# Patient Record
Sex: Male | Born: 1995 | Race: White | Hispanic: No | Marital: Single | Smoking: Never smoker
Health system: Southern US, Community
[De-identification: ages and names within clinical notes are randomized; demographics above are authoritative.]

## PROBLEM LIST (undated history)

## (undated) DIAGNOSIS — F329 Major depressive disorder, single episode, unspecified: Secondary | ICD-10-CM

## (undated) DIAGNOSIS — F32A Depression, unspecified: Secondary | ICD-10-CM

## (undated) DIAGNOSIS — F909 Attention-deficit hyperactivity disorder, unspecified type: Secondary | ICD-10-CM

## (undated) DIAGNOSIS — G4730 Sleep apnea, unspecified: Secondary | ICD-10-CM

## (undated) HISTORY — PX: WISDOM TOOTH EXTRACTION: SHX21

---

## 2015-02-28 ENCOUNTER — Encounter: Payer: Self-pay | Admitting: Emergency Medicine

## 2015-02-28 ENCOUNTER — Emergency Department
Admission: EM | Admit: 2015-02-28 | Discharge: 2015-02-28 | Disposition: A | Discharge status: Home / Self Care | Attending: Emergency Medicine | Admitting: Emergency Medicine

## 2015-02-28 DIAGNOSIS — G4730 Sleep apnea, unspecified: Secondary | ICD-10-CM | POA: Diagnosis not present

## 2015-02-28 DIAGNOSIS — Z880 Allergy status to penicillin: Secondary | ICD-10-CM | POA: Insufficient documentation

## 2015-02-28 DIAGNOSIS — F919 Conduct disorder, unspecified: Secondary | ICD-10-CM | POA: Insufficient documentation

## 2015-02-28 DIAGNOSIS — R4689 Other symptoms and signs involving appearance and behavior: Secondary | ICD-10-CM

## 2015-02-28 DIAGNOSIS — F329 Major depressive disorder, single episode, unspecified: Secondary | ICD-10-CM | POA: Diagnosis not present

## 2015-02-28 DIAGNOSIS — F32A Depression, unspecified: Secondary | ICD-10-CM | POA: Insufficient documentation

## 2015-02-28 HISTORY — DX: Major depressive disorder, single episode, unspecified: F32.9

## 2015-02-28 HISTORY — DX: Attention-deficit hyperactivity disorder, unspecified type: F90.9

## 2015-02-28 HISTORY — DX: Sleep apnea, unspecified: G47.30

## 2015-02-28 HISTORY — DX: Depression, unspecified: F32.A

## 2015-02-28 LAB — URINALYSIS COMPLETE WITH MICROSCOPIC (ARMC ONLY)
Bacteria, UA: NONE SEEN
Bilirubin Urine: NEGATIVE
Glucose, UA: NEGATIVE mg/dL
Hgb urine dipstick: NEGATIVE
Ketones, ur: NEGATIVE mg/dL
Leukocytes, UA: NEGATIVE
Nitrite: NEGATIVE
PROTEIN: NEGATIVE mg/dL
SPECIFIC GRAVITY, URINE: 1.02 (ref 1.005–1.030)
pH: 7 (ref 5.0–8.0)

## 2015-02-28 LAB — URINE DRUG SCREEN, QUALITATIVE (ARMC ONLY)
AMPHETAMINES, UR SCREEN: NOT DETECTED
Barbiturates, Ur Screen: NOT DETECTED
Benzodiazepine, Ur Scrn: NOT DETECTED
COCAINE METABOLITE, UR ~~LOC~~: NOT DETECTED
Cannabinoid 50 Ng, Ur ~~LOC~~: NOT DETECTED
MDMA (ECSTASY) UR SCREEN: NOT DETECTED
METHADONE SCREEN, URINE: NOT DETECTED
Opiate, Ur Screen: NOT DETECTED
Phencyclidine (PCP) Ur S: NOT DETECTED
TRICYCLIC, UR SCREEN: NOT DETECTED

## 2015-02-28 LAB — CBC WITH DIFFERENTIAL/PLATELET
Basophils Absolute: 0 10*3/uL (ref 0–0.1)
Basophils Relative: 1 %
Eosinophils Absolute: 0.3 10*3/uL (ref 0–0.7)
Eosinophils Relative: 4 %
HEMATOCRIT: 44.8 % (ref 40.0–52.0)
Hemoglobin: 14.8 g/dL (ref 13.0–18.0)
Lymphocytes Relative: 29 %
Lymphs Abs: 1.9 10*3/uL (ref 1.0–3.6)
MCH: 27.9 pg (ref 26.0–34.0)
MCHC: 33 g/dL (ref 32.0–36.0)
MCV: 84.5 fL (ref 80.0–100.0)
MONO ABS: 0.7 10*3/uL (ref 0.2–1.0)
Monocytes Relative: 10 %
NEUTROS ABS: 3.8 10*3/uL (ref 1.4–6.5)
NEUTROS PCT: 56 %
Platelets: 251 10*3/uL (ref 150–440)
RBC: 5.3 MIL/uL (ref 4.40–5.90)
RDW: 14.4 % (ref 11.5–14.5)
WBC: 6.7 10*3/uL (ref 3.8–10.6)

## 2015-02-28 LAB — COMPREHENSIVE METABOLIC PANEL
ALBUMIN: 5.1 g/dL — AB (ref 3.5–5.0)
ALK PHOS: 69 U/L (ref 38–126)
ALT: 51 U/L (ref 17–63)
ANION GAP: 11 (ref 5–15)
AST: 49 U/L — ABNORMAL HIGH (ref 15–41)
BUN: 11 mg/dL (ref 6–20)
CALCIUM: 9.5 mg/dL (ref 8.9–10.3)
CO2: 23 mmol/L (ref 22–32)
Chloride: 103 mmol/L (ref 101–111)
Creatinine, Ser: 0.98 mg/dL (ref 0.61–1.24)
GFR calc Af Amer: >60 mL/min (ref >60)
GFR calc non Af Amer: >60 mL/min (ref >60)
GLUCOSE: 109 mg/dL — AB (ref 65–99)
Potassium: 3.7 mmol/L (ref 3.5–5.1)
Sodium: 137 mmol/L (ref 135–145)
Total Bilirubin: 0.9 mg/dL (ref 0.3–1.2)
Total Protein: 8.4 g/dL — ABNORMAL HIGH (ref 6.5–8.1)

## 2015-02-28 LAB — ETHANOL

## 2015-02-28 NOTE — ED Notes (Signed)
BEHAVIORAL HEALTH ROUNDING Patient sleeping: No. Patient alert and oriented: yes Behavior appropriate: Yes.  ;  Nutrition and fluids offered: Yes  Toileting and hygiene offered: Yes  Sitter present: yes Law enforcement present: Yes  

## 2015-02-28 NOTE — ED Notes (Signed)
BEHAVIORAL HEALTH ROUNDING  Patient sleeping: Yes.  Patient alert and oriented: no  Behavior appropriate: Yes. ; If no, describe:  Nutrition and fluids offered: No  Toileting and hygiene offered: No  Sitter present: no  Law enforcement present: Yes   

## 2015-02-28 NOTE — ED Provider Notes (Signed)
Patient stable and calm in the ER. Seen by Dr. Toni Amendlapacs recommends discharge. Diagnosis major depression. Patient will follow up with his physician at [facility].  Sharyn CreamerMark Quale, MD 02/28/15 [phone]

## 2015-02-28 NOTE — Discharge Instructions (Signed)
Depression  Follow-up with your doctor at [facility], within the next 1-2 days. Return to the ER right away if he develops symptoms of wanting to hurt herself or others. You have hallucinations, or other new symptoms or concerns arise.  Depression is feeling sad, low, down in the dumps, blue, gloomy, or empty. In general, there are two kinds of depression:  Normal sadness or grief. This can happen after something upsetting. It often goes away on its own within 2 weeks. After losing a loved one (bereavement), normal sadness and grief may last longer than two weeks. It usually gets better with time.  Clinical depression. This kind lasts longer than normal sadness or grief. It keeps you from doing the things you normally do in life. It is often hard to function at home, work, or at school. It may affect your relationships with others. Treatment is often needed. GET HELP RIGHT AWAY IF:  You have thoughts about hurting yourself or others.  You lose touch with reality (psychotic symptoms). You may:  See or hear things that are not real.  Have untrue beliefs about your life or people around you.  Your medicine is giving you problems. MAKE SURE YOU:  Understand these instructions.  Will watch your condition.  Will get help right away if you are not doing well or get worse. Document Released: 11/10/2010 Document Revised: 02/22/2014 Document Reviewed: 02/07/2012 [facility] Patient Information 2015 [place], [place]. This information is not intended to replace advice given to you by your health care provider. Make sure you discuss any questions you have with your health care provider.
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
## 2015-02-28 NOTE — ED Notes (Signed)

## 2015-02-28 NOTE — ED Notes (Signed)
ED BHU PLACEMENT JUSTIFICATION Is the patient under IVC or is there intent for IVC: Yes.   Is the patient medically cleared: Yes.   Is there vacancy in the ED BHU: No. Is the population mix appropriate for patient: Yes.   Is the patient awaiting placement in inpatient or outpatient setting: Yes.   Has the patient had a psychiatric consult: No. Survey of unit performed for contraband, proper placement and condition of furniture, tampering with fixtures in bathroom, shower, and each patient room: Yes.  ; Findings:  APPEARANCE/BEHAVIOR calm and cooperative NEURO ASSESSMENT Orientation: time, place and person Hallucinations: No.None noted (Hallucinations) Speech: Normal Gait: normal RESPIRATORY ASSESSMENT Normal expansion.  Clear to auscultation.  No rales, rhonchi, or wheezing. CARDIOVASCULAR ASSESSMENT regular rate and rhythm, S1, S2 normal, no murmur, click, rub or gallop GASTROINTESTINAL ASSESSMENT soft, nontender, BS WNL, no r/g EXTREMITIES normal strength, tone, and muscle mass PLAN OF CARE Provide calm/safe environment. Vital signs assessed twice daily. ED BHU Assessment once each 12-hour shift. Collaborate with intake RN daily or as condition indicates. Assure the ED provider has rounded once each shift. Provide and encourage hygiene. Provide redirection as needed. Assess for escalating behavior; address immediately and inform ED provider.  Assess family dynamic and appropriateness for visitation as needed: Yes.  ; If necessary, describe findings:  Educate the patient/family about BHU procedures/visitation: Yes.  ; If necessary, describe findings:  

## 2015-02-28 NOTE — ED Notes (Addendum)
Pt presents to ER via [place] [employer]. Officer reports IVC papers are coming. Pt reports he got into an argument with his parents. Pt denies violence, HI, SI. Pt calm and polite. Handcuffs in place.
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
## 2015-02-28 NOTE — ED Notes (Signed)
Consult completed by Md Clapacs

## 2015-02-28 NOTE — ED Notes (Signed)
MD at bedside. 

## 2015-02-28 NOTE — ED Notes (Addendum)
BEHAVIORAL HEALTH ROUNDING Patient sleeping: Yes.   Patient alert and oriented: pt sleeping Behavior appropriate: Yes.  ; If no, describe:  Nutrition and fluids offered: No Toileting and hygiene offered: No Sitter present: no Law enforcement present: Yes
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
## 2015-02-28 NOTE — ED Notes (Signed)
Pt discharged home after verbalizing understanding of discharge instructions; nad noted. 

## 2015-02-28 NOTE — ED Notes (Signed)
BEHAVIORAL HEALTH ROUNDING Patient sleeping: Yes.   Patient alert and oriented: pt sleeping Behavior appropriate: Yes.  ; If no, describe:  Nutrition and fluids offered: pt sleeping Toileting and hygiene offered: pt sleeping Sitter present: no Law enforcement present: Yes  

## 2015-02-28 NOTE — ED Notes (Signed)
No BHU Vacancy at this time.

## 2015-02-28 NOTE — ED Notes (Signed)
Officer speaking with magistrate, reports she is not issuing IVC papers. Pt is voluntary at this time.

## 2015-02-28 NOTE — ED Provider Notes (Signed)
-----------------------------------------   7:42 AM on 02/28/2015 -----------------------------------------   BP 146/70 mmHg  Pulse 93  Temp(Src) 97.5 F (36.4 C) (Oral)  Resp 18  Ht 6\' 7"  (2.007 m)  Wt 332 lb (150.594 kg)  BMI 37.39 kg/m2  SpO2 97%  The patient had no acute events since last update.  Calm and cooperative at this time.  Disposition is pending per Psychiatry/Behavioral Medicine team recommendations.     Arelia Longestavid M Myranda Pavone, MD 02/28/15 [phone]

## 2016-06-03 ENCOUNTER — Emergency Department

## 2016-06-03 ENCOUNTER — Encounter: Payer: Self-pay | Admitting: Radiology

## 2016-06-03 ENCOUNTER — Observation Stay
Admission: EM | Admit: 2016-06-03 | Discharge: 2016-06-04 | Disposition: A | Discharge status: Home / Self Care | Attending: Specialist | Admitting: Specialist

## 2016-06-03 DIAGNOSIS — F329 Major depressive disorder, single episode, unspecified: Secondary | ICD-10-CM | POA: Insufficient documentation

## 2016-06-03 DIAGNOSIS — M40202 Unspecified kyphosis, cervical region: Secondary | ICD-10-CM | POA: Diagnosis not present

## 2016-06-03 DIAGNOSIS — Z833 Family history of diabetes mellitus: Secondary | ICD-10-CM | POA: Insufficient documentation

## 2016-06-03 DIAGNOSIS — R220 Localized swelling, mass and lump, head: Secondary | ICD-10-CM | POA: Diagnosis present

## 2016-06-03 DIAGNOSIS — E119 Type 2 diabetes mellitus without complications: Secondary | ICD-10-CM

## 2016-06-03 DIAGNOSIS — X58XXXA Exposure to other specified factors, initial encounter: Secondary | ICD-10-CM | POA: Diagnosis not present

## 2016-06-03 DIAGNOSIS — Z881 Allergy status to other antibiotic agents status: Secondary | ICD-10-CM | POA: Diagnosis not present

## 2016-06-03 DIAGNOSIS — Y9389 Activity, other specified: Secondary | ICD-10-CM | POA: Insufficient documentation

## 2016-06-03 DIAGNOSIS — Z7984 Long term (current) use of oral hypoglycemic drugs: Secondary | ICD-10-CM | POA: Insufficient documentation

## 2016-06-03 DIAGNOSIS — Z79899 Other long term (current) drug therapy: Secondary | ICD-10-CM | POA: Diagnosis not present

## 2016-06-03 DIAGNOSIS — F909 Attention-deficit hyperactivity disorder, unspecified type: Secondary | ICD-10-CM | POA: Diagnosis not present

## 2016-06-03 DIAGNOSIS — L03211 Cellulitis of face: Principal | ICD-10-CM | POA: Diagnosis present

## 2016-06-03 DIAGNOSIS — G4733 Obstructive sleep apnea (adult) (pediatric): Secondary | ICD-10-CM | POA: Diagnosis not present

## 2016-06-03 DIAGNOSIS — S0240DA Maxillary fracture, left side, initial encounter for closed fracture: Secondary | ICD-10-CM | POA: Diagnosis present

## 2016-06-03 DIAGNOSIS — Z880 Allergy status to penicillin: Secondary | ICD-10-CM | POA: Diagnosis not present

## 2016-06-03 DIAGNOSIS — Z8249 Family history of ischemic heart disease and other diseases of the circulatory system: Secondary | ICD-10-CM | POA: Diagnosis not present

## 2016-06-03 DIAGNOSIS — M272 Inflammatory conditions of jaws: Secondary | ICD-10-CM | POA: Insufficient documentation

## 2016-06-03 LAB — COMPREHENSIVE METABOLIC PANEL
ALT: 42 U/L (ref 17–63)
ANION GAP: 7 (ref 5–15)
AST: 31 U/L (ref 15–41)
Albumin: 5 g/dL (ref 3.5–5.0)
Alkaline Phosphatase: 68 U/L (ref 38–126)
BUN: 11 mg/dL (ref 6–20)
CALCIUM: 9.4 mg/dL (ref 8.9–10.3)
CO2: 26 mmol/L (ref 22–32)
Chloride: 104 mmol/L (ref 101–111)
Creatinine, Ser: 0.99 mg/dL (ref 0.61–1.24)
GFR calc Af Amer: >60 mL/min (ref >60)
GFR calc non Af Amer: >60 mL/min (ref >60)
Glucose, Bld: 111 mg/dL — ABNORMAL HIGH (ref 65–99)
Potassium: 3.8 mmol/L (ref 3.5–5.1)
SODIUM: 137 mmol/L (ref 135–145)
Total Bilirubin: 0.9 mg/dL (ref 0.3–1.2)
Total Protein: 8.2 g/dL — ABNORMAL HIGH (ref 6.5–8.1)

## 2016-06-03 LAB — CBC WITH DIFFERENTIAL/PLATELET
BASOS PCT: 0 %
Basophils Absolute: 0 10*3/uL (ref 0–0.1)
EOS ABS: 0.2 10*3/uL (ref 0–0.7)
EOS PCT: 2 %
HCT: 43.9 % (ref 40.0–52.0)
HEMOGLOBIN: 14.8 g/dL (ref 13.0–18.0)
Lymphocytes Relative: 15 %
Lymphs Abs: 1.3 10*3/uL (ref 1.0–3.6)
MCH: 28.1 pg (ref 26.0–34.0)
MCHC: 33.6 g/dL (ref 32.0–36.0)
MCV: 83.7 fL (ref 80.0–100.0)
MONO ABS: 0.9 10*3/uL (ref 0.2–1.0)
Monocytes Relative: 11 %
NEUTROS ABS: 6.4 10*3/uL (ref 1.4–6.5)
Neutrophils Relative %: 72 %
PLATELETS: 235 10*3/uL (ref 150–440)
RBC: 5.25 MIL/uL (ref 4.40–5.90)
RDW: 14.8 % — ABNORMAL HIGH (ref 11.5–14.5)
WBC: 8.9 10*3/uL (ref 3.8–10.6)

## 2016-06-03 MED ORDER — CLINDAMYCIN PHOSPHATE 600 MG/50ML IV SOLN
600.0000 mg | INTRAVENOUS | Status: AC
  Administered 2016-06-03: 600 mg via INTRAVENOUS

## 2016-06-03 MED ORDER — KETOROLAC TROMETHAMINE 15 MG/ML IJ SOLN
15.0000 mg | Freq: Four times a day (QID) | INTRAMUSCULAR | Status: DC | PRN
Start: 2016-06-03 — End: 2016-06-04

## 2016-06-03 MED ORDER — ACETAMINOPHEN 650 MG RE SUPP: 650.0000 mg | Freq: Four times a day (QID) | RECTAL | Status: DC | PRN

## 2016-06-03 MED ORDER — CLINDAMYCIN PHOSPHATE 600 MG/50ML IV SOLN
600.0000 mg | Freq: Three times a day (TID) | INTRAVENOUS | Status: DC
  Administered 2016-06-03 – 2016-06-04 (×2): 600 mg via INTRAVENOUS
  Filled 2016-06-03 (×5): qty 50

## 2016-06-03 MED ORDER — DEXAMETHASONE SODIUM PHOSPHATE 10 MG/ML IJ SOLN
4.0000 mg | Freq: Four times a day (QID) | INTRAMUSCULAR | Status: DC
  Filled 2016-06-03: qty 1

## 2016-06-03 MED ORDER — ONDANSETRON HCL 4 MG/2ML IJ SOLN: 4.0000 mg | Freq: Four times a day (QID) | INTRAMUSCULAR | Status: DC | PRN

## 2016-06-03 MED ORDER — ACETAMINOPHEN 325 MG PO TABS: 650.0000 mg | ORAL_TABLET | Freq: Four times a day (QID) | ORAL | Status: DC | PRN

## 2016-06-03 MED ORDER — FLUOXETINE HCL 20 MG PO CAPS: 40.0000 mg | ORAL_CAPSULE | ORAL | Status: DC

## 2016-06-03 MED ORDER — RISAQUAD PO CAPS
1.0000 | ORAL_CAPSULE | Freq: Every day | ORAL | Status: DC
  Administered 2016-06-03 – 2016-06-04 (×2): 1 via ORAL
  Filled 2016-06-03 (×3): qty 1

## 2016-06-03 MED ORDER — ENOXAPARIN SODIUM 40 MG/0.4ML ~~LOC~~ SOLN
40.0000 mg | SUBCUTANEOUS | Status: DC
  Administered 2016-06-03: 40 mg via SUBCUTANEOUS
  Filled 2016-06-03: qty 0.4

## 2016-06-03 MED ORDER — ONDANSETRON HCL 4 MG/2ML IJ SOLN
4.0000 mg | Freq: Once | INTRAMUSCULAR | Status: AC
  Administered 2016-06-03: 4 mg via INTRAVENOUS

## 2016-06-03 MED ORDER — FLUOXETINE HCL 20 MG PO CAPS
60.0000 mg | ORAL_CAPSULE | Freq: Every day | ORAL | Status: DC
  Administered 2016-06-04: 60 mg via ORAL
  Filled 2016-06-03: qty 3

## 2016-06-03 MED ORDER — DEXAMETHASONE SODIUM PHOSPHATE 10 MG/ML IJ SOLN
4.0000 mg | Freq: Four times a day (QID) | INTRAMUSCULAR | Status: DC
  Administered 2016-06-03 – 2016-06-04 (×3): 4 mg via INTRAVENOUS
  Filled 2016-06-03 (×5): qty 0.4

## 2016-06-03 MED ORDER — CHLORHEXIDINE GLUCONATE 0.12 % MT SOLN
10.0000 mL | Freq: Two times a day (BID) | OROMUCOSAL | Status: DC
Start: 2016-06-03 — End: 2016-06-04
  Administered 2016-06-03 – 2016-06-04 (×2): 10 mL via OROMUCOSAL
  Filled 2016-06-03 (×2): qty 15

## 2016-06-03 MED ORDER — HYDROCODONE-ACETAMINOPHEN 5-325 MG PO TABS: 1.0000 | ORAL_TABLET | ORAL | Status: DC | PRN

## 2016-06-03 MED ORDER — METFORMIN HCL 500 MG PO TABS
250.0000 mg | ORAL_TABLET | Freq: Two times a day (BID) | ORAL | Status: DC
  Administered 2016-06-03 – 2016-06-04 (×2): 250 mg via ORAL
  Filled 2016-06-03 (×2): qty 1

## 2016-06-03 MED ORDER — IOPAMIDOL (ISOVUE-300) INJECTION 61%
75.0000 mL | Freq: Once | INTRAVENOUS | Status: AC | PRN
  Administered 2016-06-03: 75 mL via INTRAVENOUS

## 2016-06-03 MED ORDER — FLUOXETINE HCL 20 MG PO CAPS: 20.0000 mg | ORAL_CAPSULE | ORAL | Status: DC

## 2016-06-03 NOTE — Progress Notes (Signed)
Pt has home CPAP.  CPAP is plugged into red outlet. No signs of damage to machine. Pt and family state that they will place CPAP when he is ready for bed. I encouraged pt and family to call should they need assistance.

## 2016-06-03 NOTE — Consult Note (Signed)
..   Vincent Blevins, Vincent Blevins [birth year] Enid Baasadhika Kalisetti, MD  Reason for Consult: Facial cellulitis  HPI: 20 y.o. Male s/p wisdom tooth extraction 2 weeks ago.  Reports followed up with oral surgeon and lower extraction sites irrigated out.  Developed foul taste and drainage and increase in pain from left lower mandibular extraction area.  CT scan performed and showed maxillary fracture with inflammation in maxillary sinus as well as facial swelling but no abscess.  Allergies:  Allergies  Allergen Reactions  . Amoxicillin Hives and Rash    Has patient had a PCN reaction causing immediate rash, facial/tongue/throat swelling, SOB or lightheadedness with hypotension: Yes Has patient had a PCN reaction causing severe rash involving mucus membranes or skin necrosis: No Has patient had a PCN reaction that required hospitalization patient not sure Has patient had a PCN reaction occurring within the last 10 years: No If all of the above answers are "NO", then may proceed with Cephalosporin use.  Marland Kitchen. Penicillins Hives and Rash    Has patient had a PCN reaction causing immediate rash, facial/tongue/throat swelling, SOB or lightheadedness with hypotension: Yes Has patient had a PCN reaction causing severe rash involving mucus membranes or skin necrosis: No Has patient had a PCN reaction that required hospitalization patient not sure Has patient had a PCN reaction occurring within the last 10 years: No If all of the above answers are "NO", then may proceed with Cephalosporin use.    ROS: Review of systems normal other than 12 systems except per HPI.  PMH:  Past Medical History:  Diagnosis Date  . ADHD (attention deficit hyperactivity disorder)   . Depression   . Sleep apnea     FH:  Family History  Problem Relation Age of Onset  . Diabetes Father   . Cardiomyopathy Father   . Diabetes Maternal Grandmother   . Diabetes Maternal Grandfather     SH:  Social History   Social  History  . Marital status: Single    Spouse name: N/A  . Number of children: N/A  . Years of education: N/A   Occupational History  . Not on file.   Social History Main Topics  . Smoking status: Never Smoker  . Smokeless tobacco: Never Used  . Alcohol use No  . Drug use: No  . Sexual activity: Not on file   Other Topics Concern  . Not on file   Social History Narrative   Independent    PSH:  Past Surgical History:  Procedure Laterality Date  . WISDOM TOOTH EXTRACTION      Physical  Exam:  GEN-  CN 2-12 grossly intact and symmetric. EARS- EAC/TMs normal BL.  OC/OP- mild trismus from pain, purulence and inflammation near left extraction area.  No fluctuance NOSE- Nasal cavity without polyps or purulence. External nose and ears without masses or lesions.  EYE- EOMI, PERRLA.  NECK-Neck supple with no masses or lesions. No lymphadenopathy palpated. Thyroid normal with no masses.  CT- fracture of left maxilla near wisdom tooth extraction area.  Facial edema   A/P: Facial cellulitis/mandibular infection from 3rd molar extraction  Plan:  Agree with IV abx and steroids.  No fluid collection.  Anticipate d/c tomorrow if responds.  Follow up with oral surgeon upon discharge.   Vincent Blevins 06/03/2016 5:15 PM

## 2016-06-03 NOTE — ED Notes (Signed)
Informed RN bed ready [phone]

## 2016-06-03 NOTE — ED Triage Notes (Signed)
Patient underwent wisdom tooth extraction two weeks ago, followed up as scheduled with DDS.presents now with lower left jaw swelling with swelling noted to the soft tissue of the neck. No difficulty swallowing at this time.

## 2016-06-03 NOTE — H&P (Signed)
[employer] [facility] - [place] at [facility]   PATIENT NAME: Vincent Blevins    MR#:  [id number]  DATE OF BIRTH:  Nov 29, 1995  DATE OF ADMISSION:  06/03/2016  PRIMARY CARE PHYSICIAN: No primary care provider on file.   REQUESTING/REFERRING PHYSICIAN: Dr. Sharyn Creamer  CHIEF COMPLAINT:   Chief Complaint  Patient presents with  . Dental Pain    HISTORY OF PRESENT ILLNESS:  Vincent Blevins  is a 20 y.o. male with a known history of ADHD, OSA, depression presents to the hospital secondary to left facial swelling and also pain that he noticed last night. Patient had all 4 office wisdom teeth extracted about 2 weeks ago. He was discharged on ibuprofen and Norco for pain as needed. Since yesterday he noticed that the left side of his face was more swollen and he was having intense pain. Denies any difficulty swallowing or breathing. No stridor. No trouble swallowing his secretions. He has been nauseous and had few episodes of vomiting. Denies any fevers but had chills. This morning his swelling was getting worse so presented to the emergency room. CT of the maxillofacial sinuses reveals fracture of the left maxillary sinus floor with soft tissue swelling, cellulitis, no abscess noted. ENT was consult that and recommended observation and antibiotics.  PAST MEDICAL HISTORY:   Past Medical History:  Diagnosis Date  . ADHD (attention deficit hyperactivity disorder)   . Depression   . Sleep apnea     PAST SURGICAL HISTORY:   Past Surgical History:  Procedure Laterality Date  . WISDOM TOOTH EXTRACTION      SOCIAL HISTORY:   Social History  Substance Use Topics  . Smoking status: Never Smoker  . Smokeless tobacco: Not on file  . Alcohol use No    FAMILY HISTORY:   Family History  Problem Relation Age of Onset  . Diabetes Father   . Cardiomyopathy Father   . Diabetes Maternal Grandmother   . Diabetes Maternal Grandfather     DRUG ALLERGIES:   Allergies  Allergen  Reactions  . Amoxicillin Hives and Rash    Has patient had a PCN reaction causing immediate rash, facial/tongue/throat swelling, SOB or lightheadedness with hypotension: Yes Has patient had a PCN reaction causing severe rash involving mucus membranes or skin necrosis: No Has patient had a PCN reaction that required hospitalization patient not sure Has patient had a PCN reaction occurring within the last 10 years: No If all of the above answers are "NO", then may proceed with Cephalosporin use.  Marland Kitchen Penicillins Hives and Rash    Has patient had a PCN reaction causing immediate rash, facial/tongue/throat swelling, SOB or lightheadedness with hypotension: Yes Has patient had a PCN reaction causing severe rash involving mucus membranes or skin necrosis: No Has patient had a PCN reaction that required hospitalization patient not sure Has patient had a PCN reaction occurring within the last 10 years: No If all of the above answers are "NO", then may proceed with Cephalosporin use.    REVIEW OF SYSTEMS:   Review of Systems  Constitutional: Negative for chills, fever, malaise/fatigue and weight loss.  HENT: Negative for ear discharge, ear pain, hearing loss, nosebleeds and tinnitus.        Facial pain on left side and jaw swelling  Eyes: Negative for blurred vision, double vision and photophobia.  Respiratory: Negative for cough, hemoptysis, shortness of breath and wheezing.   Cardiovascular: Negative for chest pain, palpitations, orthopnea and leg swelling.  Gastrointestinal: Negative for  abdominal pain, constipation, diarrhea, heartburn, melena, nausea and vomiting.  Genitourinary: Negative for dysuria, frequency, hematuria and urgency.  Musculoskeletal: Negative for back pain, myalgias and neck pain.  Skin: Negative for rash.  Neurological: Negative for dizziness, tingling, tremors, sensory change, speech change, focal weakness and headaches.  Endo/Heme/Allergies: Does not bruise/bleed easily.    Psychiatric/Behavioral: Negative for depression.    MEDICATIONS AT HOME:   Prior to Admission medications   Medication Sig Start Date End Date Taking? Authorizing Provider  chlorhexidine (PERIDEX) 0.12 % solution 10 mLs by Mouth Rinse route 2 (two) times daily. 05/21/16  Yes Historical Provider, MD  FLUoxetine (PROZAC) 20 MG capsule Take 20 mg by mouth every morning. Take along with 40 mg capsule to equal 60 mg total dose. 05/10/16  Yes Historical Provider, MD  FLUoxetine (PROZAC) 40 MG capsule Take 40 mg by mouth every morning. Take along with 20 mg to equal 60 mg total dose. 05/10/16  Yes Historical Provider, MD  HYDROcodone-acetaminophen (NORCO/VICODIN) 5-325 MG tablet Take 1 tablet by mouth every 6 (six) hours as needed for breakthrough pain. 05/21/16  Yes Historical Provider, MD  ibuprofen (ADVIL,MOTRIN) 600 MG tablet Take 600 mg by mouth every 6 (six) hours as needed for pain. 05/21/16  Yes Historical Provider, MD  metFORMIN (GLUCOPHAGE) 500 MG tablet Take 250 mg by mouth 2 (two) times daily with a meal. 04/17/16  Yes Historical Provider, MD      VITAL SIGNS:  Blood pressure (!) 103/53, pulse 83, temperature 98.9 F (37.2 C), resp. rate 18, height  (2.007 m), weight (!) 141.5 kg (312 lb), SpO2 98 %.  PHYSICAL EXAMINATION:   Physical Exam  GENERAL:  20 y.o.-year-old obese patient sitting in the bed with no acute distress.  EYES: Pupils equal, round, reactive to light and accommodation. No scleral icterus. Extraocular muscles intact.  HEENT: Head atraumatic, normocephalic. Left facial swelling noted. And tenderness of left maxillary sinus palpation. Oropharynx and nasopharynx clear.  NECK:  Supple, swelling towards left jaw, tenderness, no lymphadenopathy, no jugular venous distention. No thyroid enlargement, no tenderness.  LUNGS: Normal breath sounds bilaterally, no wheezing, rales,rhonchi or crepitation. No use of accessory muscles of respiration.  CARDIOVASCULAR: S1, S2 normal.  No murmurs, rubs, or gallops.  ABDOMEN: Soft, nontender, nondistended. Bowel sounds present. No organomegaly or mass.  EXTREMITIES: No pedal edema, cyanosis, or clubbing.  NEUROLOGIC: Cranial nerves II through XII are intact. Muscle strength 5/5 in all extremities. Sensation intact. Gait not checked.  PSYCHIATRIC: The patient is alert and oriented x 3.   SKIN: No obvious rash, lesion, or ulcer.   LABORATORY PANEL:   CBC  Recent Labs Lab 06/03/16 1250  WBC 8.9  HGB 14.8  HCT 43.9  PLT 235   ------------------------------------------------------------------------------------------------------------------  Chemistries   Recent Labs Lab 06/03/16 1250  NA 137  K 3.8  CL 104  CO2 26  GLUCOSE 111*  BUN 11  CREATININE 0.99  CALCIUM 9.4  AST 31  ALT 42  ALKPHOS 68  BILITOT 0.9   ------------------------------------------------------------------------------------------------------------------  Cardiac Enzymes No results for input(s): TROPONINI in the last 168 hours. ------------------------------------------------------------------------------------------------------------------  RADIOLOGY:  Ct Soft Tissue Neck W Contrast  Result Date: 06/03/2016 CLINICAL DATA:  Left facial swelling. Wisdom tooth extraction 2 weeks ago. EXAM: CT NECK WITH CONTRAST TECHNIQUE: Multidetector CT imaging of the neck was performed using the standard protocol following the bolus administration of intravenous contrast. CONTRAST:  75mL ISOVUE-300 IOPAMIDOL (ISOVUE-300) INJECTION 61% COMPARISON:  None. FINDINGS: Image quality degraded by  large patient size and mild patient motion. Pharynx and larynx: Pharyngeal soft tissues normal. No mass or abscess. Epiglottis and larynx normal. Soft tissue edema in the left face overlying the lateral mandible and maxilla. This could be due to bruising from wisdom tooth extraction and fracture of the left maxilla. Cellulitis not excluded. No soft tissue abscess. No  focal fluid collection. Salivary glands: Parotid and submandibular glands normal bilaterally. Thyroid: Not well seen due to motion. Lymph nodes: Right level 2 lymph node 12 mm with fatty hilum. Left level 2 lymph node 15 mm with fatty hilum. No pathologic adenopathy. Vascular: Carotid artery and jugular vein patent bilaterally. Limited intracranial: Negative Visualized orbits: Negative Mastoids and visualized paranasal sinuses: Mucosal edema in the left paranasal sinus. Remaining sinuses clear. No air-fluid level. Skeleton: Fracture of the socket for the left upper third molar extraction. Fracture extends into the maxillary sinus lateral wall and is likely the cause of mucosal thickening in the maxillary sinus. No other fractures. There has been removal of all 4 wisdom teeth. Cervical kyphosis. No acute spinal abnormality. Upper chest: Lung apices clear. IMPRESSION: Recent wisdom tooth extraction with fracture the socket of the left upper third molar. Fracture extends into the left maxillary sinus. There is soft tissue edema overlying the left face likely due to bruising. Cellulitis not excluded. No abscess. There is mucosal edema in the left maxillary sinus likely related to fracture. Electronically Signed   By: Marlan Palauharles  Clark M.D.   On: 06/03/2016 14:08    EKG:  No orders found for this or any previous visit.  IMPRESSION AND PLAN:   Vincent Blevins  is a 20 y.o. male with a known history of ADHD, OSA, depression presents to the hospital secondary to left facial swelling and also pain that he noticed last night.  #1 left facial swelling-secondary to cellulitis, complication from his wisdom teeth extraction with fracture extending into the left maxillary sinus. -No abscess noted. Continue clindamycin. Decadron added. -Pain medications. -ENT consult.   #2 OSA-continue CPAP at bedtime  #3 depression-on Prozac  #4 DVT Prophylaxis- lovenox     All the records are reviewed and case discussed with  ED provider. Management plans discussed with the patient, family and they are in agreement.  CODE STATUS: Full code  TOTAL TIME TAKING CARE OF THIS PATIENT: 50 minutes.    Enid BaasKALISETTI,Pantelis Elgersma M.D on 06/03/2016 at 4:07 PM  Between 7am to 6pm - Pager - [phone]  After 6pm go to [email or web address] - Social research officer, governmentpassword [email or web address] [email or web address]  [facility]  Office  [phone]  CC: Primary care physician; No primary care provider on file.

## 2016-06-03 NOTE — ED Triage Notes (Signed)
Pt has no c/o pain. No difficulty breathing. Swelling started yesterday at noon in the face, noticed in neck this am.

## 2016-06-03 NOTE — ED Provider Notes (Signed)
[facility] Emergency Department Provider Note   ____________________________________________   First MD Initiated Contact with Patient 06/03/16 1245     (approximate)  I have reviewed the triage vital signs and the nursing notes.   HISTORY  Chief Complaint Dental Pain    HPI Vincent Blevins is a 20 y.o. male presents for evaluation of swelling on the left jaw.  Patient reports his wisdom teeth removed about a week ago. Yesterday while in class he noticed that he is starting to develop some swelling and a bad taste in the left lower part of his mouth. This morning when he woke up he notices swelling had increased and is concerned about infection in his wisdom tooth on the bottom. He denies being in pain. He states he has been spitting up some because it tasted very bad what is draining, but he is able to swallow without difficulty. No trouble breathing.  No nausea or vomiting. No fevers or chills.  No headache.  No shortness of breath. No cough. No chest pain.  Past Medical History:  Diagnosis Date  . ADHD (attention deficit hyperactivity disorder)   . Depression   . Sleep apnea     Patient Active Problem List   Diagnosis Date Noted  . Aggressive behavior   . Depression     No past surgical history on file.  Prior to Admission medications   Medication Sig Start Date End Date Taking? Authorizing Provider  chlorhexidine (PERIDEX) 0.12 % solution 10 mLs by Mouth Rinse route 2 (two) times daily. 05/21/16  Yes Historical Provider, MD  FLUoxetine (PROZAC) 20 MG capsule Take 20 mg by mouth every morning. Take along with 40 mg capsule to equal 60 mg total dose. 05/10/16  Yes Historical Provider, MD  FLUoxetine (PROZAC) 40 MG capsule Take 40 mg by mouth every morning. Take along with 20 mg to equal 60 mg total dose. 05/10/16  Yes Historical Provider, MD  HYDROcodone-acetaminophen (NORCO/VICODIN) 5-325 MG tablet Take 1 tablet by mouth every 6 (six)  hours as needed for breakthrough pain. 05/21/16  Yes Historical Provider, MD  ibuprofen (ADVIL,MOTRIN) 600 MG tablet Take 600 mg by mouth every 6 (six) hours as needed for pain. 05/21/16  Yes Historical Provider, MD  metFORMIN (GLUCOPHAGE) 500 MG tablet Take 250 mg by mouth 2 (two) times daily with a meal. 04/17/16  Yes Historical Provider, MD  Metformin   Allergies Amoxicillin and Penicillins  No family history on file.  Social History Social History  Substance Use Topics  . Smoking status: Never Smoker  . Smokeless tobacco: Not on file  . Alcohol use No    Review of Systems Constitutional: No fever/chills Eyes: No visual changes. ENT: See history of present illness Cardiovascular: Denies chest pain. Respiratory: Denies shortness of breath. Gastrointestinal: No abdominal pain.  No nausea, no vomiting.  No diarrhea.  No constipation. Genitourinary: Negative for dysuria. Musculoskeletal: Negative for back pain. Skin: Negative for rash. Neurological: Negative for headaches, focal weakness or numbness.  10-point ROS otherwise negative.  ____________________________________________   PHYSICAL EXAM:  VITAL SIGNS: ED Triage Vitals  Enc Vitals Group     BP 06/03/16 1245 117/72     Pulse Rate 06/03/16 1239 93     Resp 06/03/16 1239 20     Temp 06/03/16 1239 98.9 F (37.2 C)     Temp src --      SpO2 06/03/16 1239 98 %     Weight 06/03/16 1240 (!) 312 lb (141.5  kg)     Height 06/03/16 1240  (2.007 m)     Head Circumference --      Peak Flow --      Pain Score 06/03/16 1241 1     Pain Loc --      Pain Edu? --      Excl. in GC? --     Constitutional: Alert and oriented. Well appearing and in no acute distress. Eyes: Conjunctivae are normal. PERRL. EOMI. Head: Atraumatic. Nose: No congestion/rhinnorhea. Mouth/Throat: Mucous membranes are moist.  Oropharynx non-erythematousExcept for the left posterior wisdom tooth extraction site is draining slightly purulent  tinged fluid. The patient has modest edema surrounding the left side of the lower jaw and anterior neck. The oropharynx is patent, tonsils normal, and there is no sub-lingular edema noted. His airway appears to be widely patent at this time. Neck: No stridor.   Cardiovascular: Normal rate, regular rhythm. Grossly normal heart sounds.  Good peripheral circulation. Respiratory: Normal respiratory effort.  No retractions. Lungs CTAB. Gastrointestinal: Soft and nontender. No distention. No abdominal bruits. Musculoskeletal: No lower extremity tenderness nor edema.   Neurologic:  Normal speech and language. No gross focal neurologic deficits are appreciated.  Skin:  Skin is warm, dry and intact. No rash noted. Psychiatric: Mood and affect are normal. Speech and behavior are normal.  ____________________________________________   LABS (all labs ordered are listed, but only abnormal results are displayed)  Labs Reviewed  CBC WITH DIFFERENTIAL/PLATELET - Abnormal; Notable for the following:       Result Value   RDW 14.8 (*)    All other components within normal limits  COMPREHENSIVE METABOLIC PANEL - Abnormal; Notable for the following:    Glucose, Bld 111 (*)    Total Protein 8.2 (*)    All other components within normal limits   ____________________________________________  EKG   ____________________________________________  RADIOLOGY  Ct Soft Tissue Neck W Contrast  Result Date: 06/03/2016 CLINICAL DATA:  Left facial swelling. Wisdom tooth extraction 2 weeks ago. EXAM: CT NECK WITH CONTRAST TECHNIQUE: Multidetector CT imaging of the neck was performed using the standard protocol following the bolus administration of intravenous contrast. CONTRAST:  75mL ISOVUE-300 IOPAMIDOL (ISOVUE-300) INJECTION 61% COMPARISON:  None. FINDINGS: Image quality degraded by large patient size and mild patient motion. Pharynx and larynx: Pharyngeal soft tissues normal. No mass or abscess. Epiglottis and  larynx normal. Soft tissue edema in the left face overlying the lateral mandible and maxilla. This could be due to bruising from wisdom tooth extraction and fracture of the left maxilla. Cellulitis not excluded. No soft tissue abscess. No focal fluid collection. Salivary glands: Parotid and submandibular glands normal bilaterally. Thyroid: Not well seen due to motion. Lymph nodes: Right level 2 lymph node 12 mm with fatty hilum. Left level 2 lymph node 15 mm with fatty hilum. No pathologic adenopathy. Vascular: Carotid artery and jugular vein patent bilaterally. Limited intracranial: Negative Visualized orbits: Negative Mastoids and visualized paranasal sinuses: Mucosal edema in the left paranasal sinus. Remaining sinuses clear. No air-fluid level. Skeleton: Fracture of the socket for the left upper third molar extraction. Fracture extends into the maxillary sinus lateral wall and is likely the cause of mucosal thickening in the maxillary sinus. No other fractures. There has been removal of all 4 wisdom teeth. Cervical kyphosis. No acute spinal abnormality. Upper chest: Lung apices clear. IMPRESSION: Recent wisdom tooth extraction with fracture the socket of the left upper third molar. Fracture extends into the left maxillary sinus.  There is soft tissue edema overlying the left face likely due to bruising. Cellulitis not excluded. No abscess. There is mucosal edema in the left maxillary sinus likely related to fracture. Electronically Signed   By: Marlan Palauharles  Clark M.D.   On: 06/03/2016 14:08    ____________________________________________   PROCEDURES  Procedure(s) performed: None  Procedures  Critical Care performed: No  ____________________________________________   INITIAL IMPRESSION / ASSESSMENT AND PLAN / ED COURSE  Pertinent labs & imaging results that were available during my care of the patient were reviewed by me and considered in my medical decision making (see chart for  details).  Patient's airway is patent at this time. No evidence of impending respiratory compromise. We'll proceed with CT, and initiate IV antibiotic. ----------------------------------------- 1:16 PM on 06/03/2016 -----------------------------------------  Discussed with Dr. Andee PolesVaught. He advises obtaining a CT, monitor is no evidence of mandibular infection/osteomyelitis of that particular region that he would recommend admission for IV antibiotics and close monitoring. Plan to discuss CT with Dr. Andee PolesVaught on completion.  ----------------------------------------- 2:43 PM on 06/03/2016 -----------------------------------------  Reviewed CT with Dr. Andee PolesVaught. Due to the extensive amount of facial swelling, he advises admission to the hospital and will perform consult for him this afternoon. Advises clindamycin, and admission to internal medicine. Discussed with the patient is also in agreement.  Clinical Course  Value Comment By Time  CT SOFT TISSUE NECK W CONTRAST (Reviewed) Sharyn CreamerMark Quale, MD 08/13 1312     ____________________________________________   FINAL CLINICAL IMPRESSION(S) / ED DIAGNOSES  Final diagnoses:  Left facial swelling  Maxillary fracture, left side, initial encounter for closed fracture  Facial cellulitis      NEW MEDICATIONS STARTED DURING THIS VISIT:  New Prescriptions   No medications on file     Note:  This document was prepared using Dragon voice recognition software and may include unintentional dictation errors.     Sharyn CreamerMark Quale, MD 06/03/16 [id number]

## 2016-06-04 LAB — CBC
HCT: 41.9 % (ref 40.0–52.0)
HEMOGLOBIN: 14.5 g/dL (ref 13.0–18.0)
MCH: 28.9 pg (ref 26.0–34.0)
MCHC: 34.7 g/dL (ref 32.0–36.0)
MCV: 83.4 fL (ref 80.0–100.0)
PLATELETS: 240 10*3/uL (ref 150–440)
RBC: 5.02 MIL/uL (ref 4.40–5.90)
RDW: 15 % — ABNORMAL HIGH (ref 11.5–14.5)
WBC: 9.1 10*3/uL (ref 3.8–10.6)

## 2016-06-04 MED ORDER — CLINDAMYCIN HCL 300 MG PO CAPS: 300.0000 mg | ORAL_CAPSULE | Freq: Three times a day (TID) | ORAL | 0 refills | Status: AC

## 2016-06-04 NOTE — Progress Notes (Signed)
Denies pain overnight; tolerating IV Antibx; No acute distress this am; Windy Carinaurner,Livianna Petraglia K, RN 6:48 AM 06/04/2016

## 2016-06-04 NOTE — Progress Notes (Signed)
Patient discharged home with family.  All discharge instructions reviewed and discharge paperwork given to patient.  Patient verbalized understanding.  IV removed in tact. Prescription given to patient with all questions and concerns addressed. Patient's mother at bedside for transfer home.

## 2016-06-04 NOTE — Discharge Summary (Signed)
[employer] [facility] - [place] at [facility]   PATIENT NAME: Vincent Blevins    MR#:  [id number]  DATE OF BIRTH:  [birth year]  DATE OF ADMISSION:  06/03/2016 ADMITTING PHYSICIAN: Enid Baasadhika Kalisetti, MD  DATE OF DISCHARGE: 06/04/2016 12:15 PM  PRIMARY CARE PHYSICIAN: No primary care provider on file.    ADMISSION DIAGNOSIS:  Left facial swelling [R22.0] Facial cellulitis [L03.211] Maxillary fracture, left side, initial encounter for closed fracture [S02.40DA]  DISCHARGE DIAGNOSIS:  Active Problems:   Facial cellulitis   SECONDARY DIAGNOSIS:   Past Medical History:  Diagnosis Date  . ADHD (attention deficit hyperactivity disorder)   . Depression   . Sleep apnea     HOSPITAL COURSE:   Vincent Blevins  is a 20 y.o. male with a known history of ADHD, OSA, depression presents to the hospital secondary to left facial swelling and also pain.  #1 left facial swelling-secondary to cellulitis, complication from his wisdom teeth extraction with fracture extending into the left maxillary sinus. --Patient was admitted to the hospital started on IV Decadron and IV clindamycin. An ENT consult was obtained and they agreed with this management. -Shortly after treatment with steroids and antibiotics his swelling and pain has improved. He is now being discharged on oral clindamycin with follow-up with his oral surgeon as an outpatient.   #2 OSA- he will cont. His CPAP at home.   #3 depression- he will cont. His Prozac  #4 Diabetes - pt. Will resume his Metformin.   DISCHARGE CONDITIONS:   Stable  CONSULTS OBTAINED:  Treatment Team:  Bud Facereighton Vaught, MD  DRUG ALLERGIES:   Allergies  Allergen Reactions  . Amoxicillin Hives and Rash    Has patient had a PCN reaction causing immediate rash, facial/tongue/throat swelling, SOB or lightheadedness with hypotension: Yes Has patient had a PCN reaction causing severe rash involving mucus membranes or skin necrosis: No Has  patient had a PCN reaction that required hospitalization patient not sure Has patient had a PCN reaction occurring within the last 10 years: No If all of the above answers are "NO", then may proceed with Cephalosporin use.  Marland Kitchen. Penicillins Hives and Rash    Has patient had a PCN reaction causing immediate rash, facial/tongue/throat swelling, SOB or lightheadedness with hypotension: Yes Has patient had a PCN reaction causing severe rash involving mucus membranes or skin necrosis: No Has patient had a PCN reaction that required hospitalization patient not sure Has patient had a PCN reaction occurring within the last 10 years: No If all of the above answers are "NO", then may proceed with Cephalosporin use.    DISCHARGE MEDICATIONS:     Medication List    TAKE these medications   chlorhexidine 0.12 % solution Commonly known as:  PERIDEX 10 mLs by Mouth Rinse route 2 (two) times daily.   clindamycin 300 MG capsule Commonly known as:  CLEOCIN Take 1 capsule (300 mg total) by mouth 3 (three) times daily.   FLUoxetine 40 MG capsule Commonly known as:  PROZAC Take 40 mg by mouth every morning. Take along with 20 mg to equal 60 mg total dose.   FLUoxetine 20 MG capsule Commonly known as:  PROZAC Take 20 mg by mouth every morning. Take along with 40 mg capsule to equal 60 mg total dose.   HYDROcodone-acetaminophen 5-325 MG tablet Commonly known as:  NORCO/VICODIN Take 1 tablet by mouth every 6 (six) hours as needed for breakthrough pain.   ibuprofen 600 MG tablet Commonly known as:  ADVIL,MOTRIN  Take 600 mg by mouth every 6 (six) hours as needed for pain.   metFORMIN 500 MG tablet Commonly known as:  GLUCOPHAGE Take 250 mg by mouth 2 (two) times daily with a meal.         DISCHARGE INSTRUCTIONS:   DIET:  Diabetic diet  DISCHARGE CONDITION:  Stable  ACTIVITY:  Activity as tolerated  OXYGEN:  Home Oxygen: No.   Oxygen Delivery: room air  DISCHARGE LOCATION:  home    If you experience worsening of your admission symptoms, develop shortness of breath, life threatening emergency, suicidal or homicidal thoughts you must seek medical attention immediately by calling 911 or calling your MD immediately  if symptoms less severe.  You Must read complete instructions/literature along with all the possible adverse reactions/side effects for all the Medicines you take and that have been prescribed to you. Take any new Medicines after you have completely understood and accpet all the possible adverse reactions/side effects.   Please note  You were cared for by a hospitalist during your hospital stay. If you have any questions about your discharge medications or the care you received while you were in the hospital after you are discharged, you can call the unit and asked to speak with the hospitalist on call if the hospitalist that took care of you is not available. Once you are discharged, your primary care physician will handle any further medical issues. Please note that NO REFILLS for any discharge medications will be authorized once you are discharged, as it is imperative that you return to your primary care physician (or establish a relationship with a primary care physician if you do not have one) for your aftercare needs so that they can reassess your need for medications and monitor your lab values.     Today   Left sided jaw swelling and pain improved.  NO fever. Feels better. Wants to go home.   VITAL SIGNS:  Blood pressure (!) 120/59, pulse 64, temperature 97.5 F (36.4 C), temperature source Oral, resp. rate 16, height 6\' 7"  (2.007 m), weight (!) 141.5 kg (312 lb), SpO2 98 %.  I/O:   Intake/Output Summary (Last 24 hours) at 06/04/16 1559 Last data filed at 06/04/16 0800  Gross per 24 hour  Intake              520 ml  Output             2050 ml  Net            -1530 ml    PHYSICAL EXAMINATION:  GENERAL:  20 y.o.-year-old obese patient lying in  bed in no acute distress.  EYES: Pupils equal, round, reactive to light and accommodation. No scleral icterus. Extraocular muscles intact.  HEENT: Head atraumatic, normocephalic. Oropharynx and nasopharynx clear.  NECK:  Supple, no jugular venous distention. No thyroid enlargement, no tenderness.  LUNGS: Normal breath sounds bilaterally, no wheezing, rales,rhonchi. No use of accessory muscles of respiration.  CARDIOVASCULAR: S1, S2 normal. No murmurs, rubs, or gallops.  ABDOMEN: Soft, non-tender, non-distended. Bowel sounds present. No organomegaly or mass.  EXTREMITIES: No pedal edema, cyanosis, or clubbing.  NEUROLOGIC: Cranial nerves II through XII are intact. No focal motor or sensory defecits b/l.  PSYCHIATRIC: The patient is alert and oriented x 3. Good affect.  SKIN: No obvious rash, lesion, or ulcer.   DATA REVIEW:   CBC  Recent Labs Lab 06/04/16 0453  WBC 9.1  HGB 14.5  HCT 41.9  PLT 240  Chemistries   Recent Labs Lab 06/03/16 1250  NA 137  K 3.8  CL 104  CO2 26  GLUCOSE 111*  BUN 11  CREATININE 0.99  CALCIUM 9.4  AST 31  ALT 42  ALKPHOS 68  BILITOT 0.9    Cardiac Enzymes No results for input(s): TROPONINI in the last 168 hours.  Microbiology Results  No results found for this or any previous visit.  RADIOLOGY:  Ct Soft Tissue Neck W Contrast  Result Date: 06/03/2016 CLINICAL DATA:  Left facial swelling. Wisdom tooth extraction 2 weeks ago. EXAM: CT NECK WITH CONTRAST TECHNIQUE: Multidetector CT imaging of the neck was performed using the standard protocol following the bolus administration of intravenous contrast. CONTRAST:  75mL ISOVUE-300 IOPAMIDOL (ISOVUE-300) INJECTION 61% COMPARISON:  None. FINDINGS: Image quality degraded by large patient size and mild patient motion. Pharynx and larynx: Pharyngeal soft tissues normal. No mass or abscess. Epiglottis and larynx normal. Soft tissue edema in the left face overlying the lateral mandible and maxilla.  This could be due to bruising from wisdom tooth extraction and fracture of the left maxilla. Cellulitis not excluded. No soft tissue abscess. No focal fluid collection. Salivary glands: Parotid and submandibular glands normal bilaterally. Thyroid: Not well seen due to motion. Lymph nodes: Right level 2 lymph node 12 mm with fatty hilum. Left level 2 lymph node 15 mm with fatty hilum. No pathologic adenopathy. Vascular: Carotid artery and jugular vein patent bilaterally. Limited intracranial: Negative Visualized orbits: Negative Mastoids and visualized paranasal sinuses: Mucosal edema in the left paranasal sinus. Remaining sinuses clear. No air-fluid level. Skeleton: Fracture of the socket for the left upper third molar extraction. Fracture extends into the maxillary sinus lateral wall and is likely the cause of mucosal thickening in the maxillary sinus. No other fractures. There has been removal of all 4 wisdom teeth. Cervical kyphosis. No acute spinal abnormality. Upper chest: Lung apices clear. IMPRESSION: Recent wisdom tooth extraction with fracture the socket of the left upper third molar. Fracture extends into the left maxillary sinus. There is soft tissue edema overlying the left face likely due to bruising. Cellulitis not excluded. No abscess. There is mucosal edema in the left maxillary sinus likely related to fracture. Electronically Signed   By: Marlan Palauharles  Clark M.D.   On: 06/03/2016 14:08      Management plans discussed with the patient, family and they are in agreement.  CODE STATUS:  Code Status History    Date Active Date Inactive Code Status Order ID Comments User Context   06/03/2016  4:18 PM 06/04/2016  3:20 PM Full Code [id number]  Enid Baasadhika Kalisetti, MD Inpatient      TOTAL TIME TAKING CARE OF THIS PATIENT: 40 minutes.    Houston SirenSAINANI,VIVEK J M.D on 06/04/2016 at 3:59 PM  Between 7am to 6pm - Pager - [phone]  After 6pm go to [email or web address] - password [email or web address] [facility]  Office  [phone]  CC: Primary care physician; No primary care provider on file.

## 2016-06-04 NOTE — Discharge Instructions (Signed)
Cellulitis °Cellulitis is an infection of the skin and the tissue under the skin. The infected area is usually red and tender. This happens most often in the arms and lower legs. °HOME CARE  °· Take your antibiotic medicine as told. Finish the medicine even if you start to feel better. °· Keep the infected arm or leg raised (elevated). °· Put a warm cloth on the area up to 4 times per day. °· Only take medicines as told by your doctor. °· Keep all doctor visits as told. °GET HELP IF: °· You see red streaks on the skin coming from the infected area. °· Your red area gets bigger or turns a dark color. °· Your bone or joint under the infected area is painful after the skin heals. °· Your infection comes back in the same area or different area. °· You have a puffy (swollen) bump in the infected area. °· You have new symptoms. °· You have a fever. °GET HELP RIGHT AWAY IF:  °· You feel very sleepy. °· You throw up (vomit) or have watery poop (diarrhea). °· You feel sick and have muscle aches and pains. °  °This information is not intended to replace advice given to you by your health care provider. Make sure you discuss any questions you have with your health care provider. °  °Document Released: 03/26/2008 Document Revised: 06/29/2015 Document Reviewed: 12/24/2011 °Elsevier Interactive Patient Education ©2016 [employer] ° °

## 2016-06-04 NOTE — Progress Notes (Signed)
.. 06/04/2016 8:22 AM  Vincent Blevins, Vincent Blevins [id number]  Hospital Day  2    Temp:  [97.5 F (36.4 C)-98.9 F (37.2 C)] 97.5 F (36.4 C) (08/14 0539) Pulse Rate:  [64-93] 64 (08/14 0539) Resp:  [16-20] 16 (08/14 0539) BP: (103-127)/(48-72) 120/59 (08/14 0539) SpO2:  [96 %-100 %] 98 % (08/14 0539) Weight:  [141.5 kg (312 lb)] 141.5 kg (312 lb) (08/13 1512),     Intake/Output Summary (Last 24 hours) at 06/04/16 [id number] Last data filed at 06/04/16 0539  Gross per 24 hour  Intake              410 ml  Output             2050 ml  Net            -1640 ml    Results for orders placed or performed during the hospital encounter of 06/03/16 (from the past 24 hour(s))  CBC with Differential     Status: Abnormal   Collection Time: 06/03/16 12:50 PM  Result Value Ref Range   WBC 8.9 3.8 - 10.6 K/uL   RBC 5.25 4.40 - 5.90 MIL/uL   Hemoglobin 14.8 13.0 - 18.0 g/dL   HCT 78.443.9 69.640.0 - 29.552.0 %   MCV 83.7 80.0 - 100.0 fL   MCH 28.1 26.0 - 34.0 pg   MCHC 33.6 32.0 - 36.0 g/dL   RDW 28.414.8 (H) 13.211.5 - 44.014.5 %   Platelets 235 150 - 440 K/uL   Neutrophils Relative % 72 %   Neutro Abs 6.4 1.4 - 6.5 K/uL   Lymphocytes Relative 15 %   Lymphs Abs 1.3 1.0 - 3.6 K/uL   Monocytes Relative 11 %   Monocytes Absolute 0.9 0.2 - 1.0 K/uL   Eosinophils Relative 2 %   Eosinophils Absolute 0.2 0 - 0.7 K/uL   Basophils Relative 0 %   Basophils Absolute 0.0 0 - 0.1 K/uL  Comprehensive metabolic panel     Status: Abnormal   Collection Time: 06/03/16 12:50 PM  Result Value Ref Range   Sodium 137 135 - 145 mmol/L   Potassium 3.8 3.5 - 5.1 mmol/L   Chloride 104 101 - 111 mmol/L   CO2 26 22 - 32 mmol/L   Glucose, Bld 111 (H) 65 - 99 mg/dL   BUN 11 6 - 20 mg/dL   Creatinine, Ser 1.020.99 0.61 - 1.24 mg/dL   Calcium 9.4 8.9 - 72.510.3 mg/dL   Total Protein 8.2 (H) 6.5 - 8.1 g/dL   Albumin 5.0 3.5 - 5.0 g/dL   AST 31 15 - 41 U/L   ALT 42 17 - 63 U/L   Alkaline Phosphatase 68 38 - 126 U/L   Total Bilirubin 0.9 0.3 - 1.2  mg/dL   GFR calc non Af Amer >60 >60 mL/min   GFR calc Af Amer >60 >60 mL/min   Anion gap 7 5 - 15  CBC     Status: Abnormal   Collection Time: 06/04/16  4:53 AM  Result Value Ref Range   WBC 9.1 3.8 - 10.6 K/uL   RBC 5.02 4.40 - 5.90 MIL/uL   Hemoglobin 14.5 13.0 - 18.0 g/dL   HCT 36.641.9 44.040.0 - 34.752.0 %   MCV 83.4 80.0 - 100.0 fL   MCH 28.9 26.0 - 34.0 pg   MCHC 34.7 32.0 - 36.0 g/dL   RDW 42.515.0 (H) 95.611.5 - 38.714.5 %   Platelets 240 150 - 440 K/uL    SUBJECTIVE:  No acute events overnight.  Improved drainage and taste.  No pain.  OBJECTIVE:   GEN- NAD, resting with CPAP on OC/OP-  Improved erythema and edema surrounding lower mandibular molar.  No drainage from maxilla  IMPRESSION:  Oral cavity cellulitis following molar extraction  PLAN:  OK to discharge on oral medication and follow up with oral surgeon.  Vincent Blevins 06/04/2016, 8:22 AM

## 2017-07-21 IMAGING — CT CT NECK W/ CM
4 of 5 series · 14 of 33 positions shown, 16 images · IV contrast (iopamidol)
Comparison: None.

CLINICAL DATA: Left facial swelling. Wisdom tooth extraction 2
weeks ago.

EXAM:
CT NECK WITH CONTRAST
TECHNIQUE: Multidetector CT imaging of the neck was performed using the
standard protocol following the bolus administration of intravenous
contrast.
CONTRAST:  75mL 3RBBC5-KGG IOPAMIDOL (3RBBC5-KGG) INJECTION 61%

[Series 2: axial neck · axial · 0.54mm/px · z∈[-245,-105]mm · 4 of 118 slices shown, 5 images]
[im 24/118  soft-tissue]
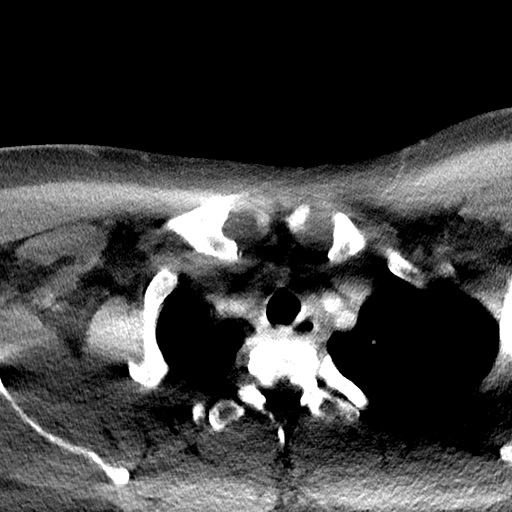
[im 24/118  bone]
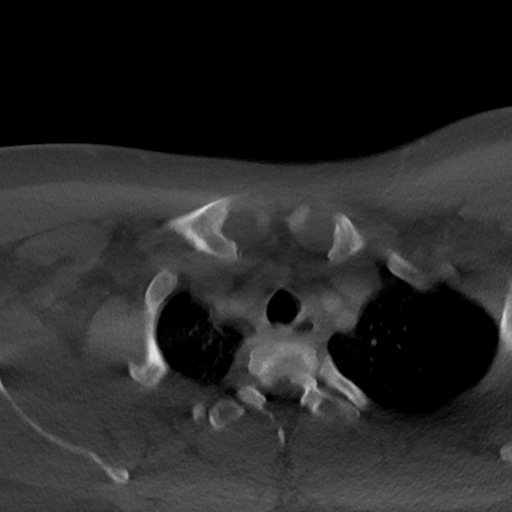
[im 47/118  bone]
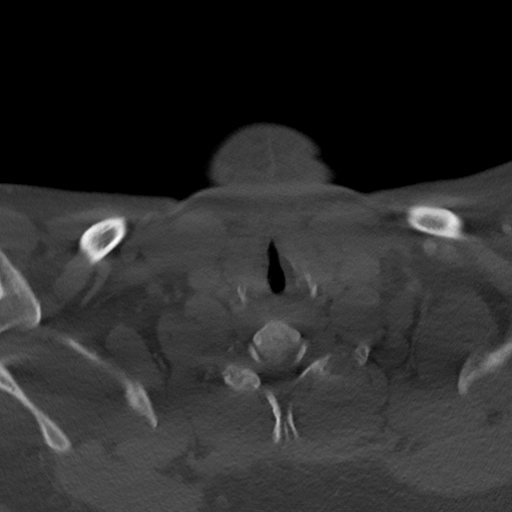
[im 71/118  bone]
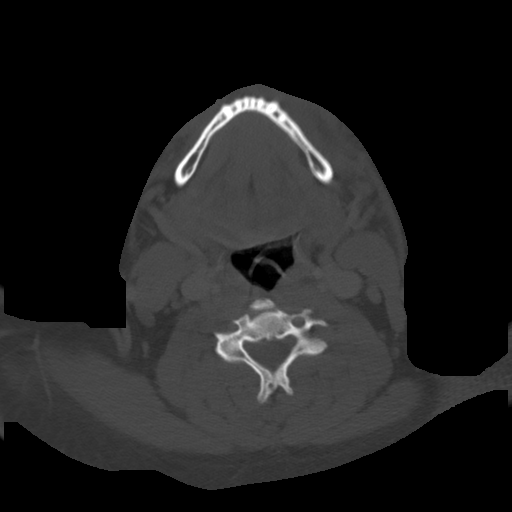
[im 94/118  bone]
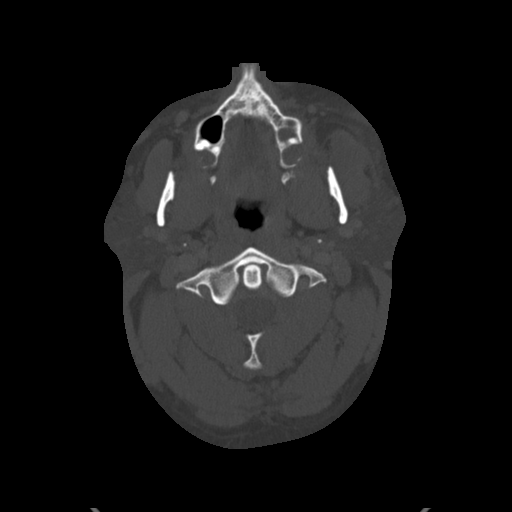

[Series 5: sag neck · sagittal · 0.46mm/px · 5 of 91 slices shown, 6 images]
[im 31/91  bone]
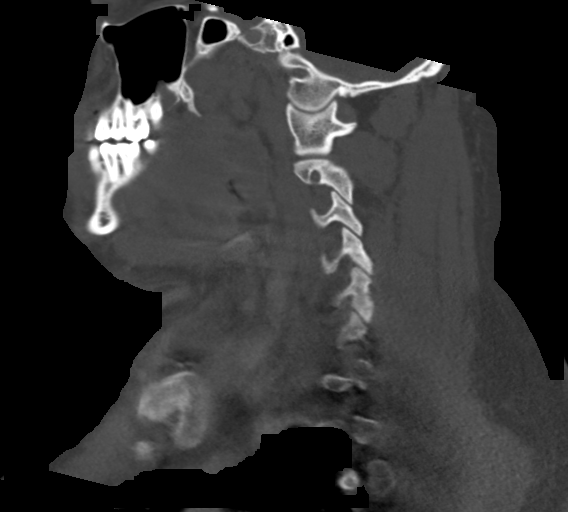
[im 38/91  bone]
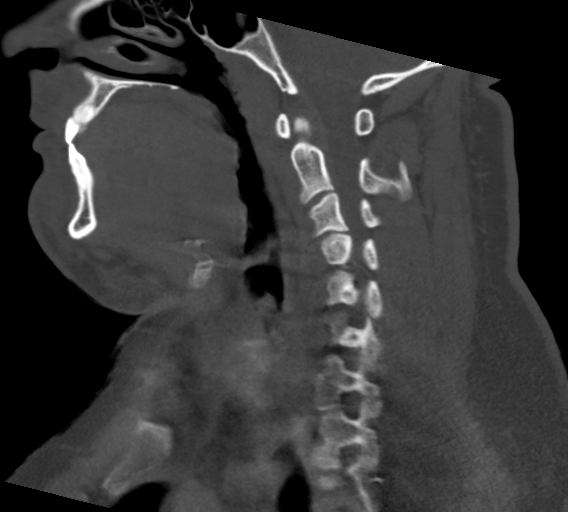
[im 46/91  soft-tissue]
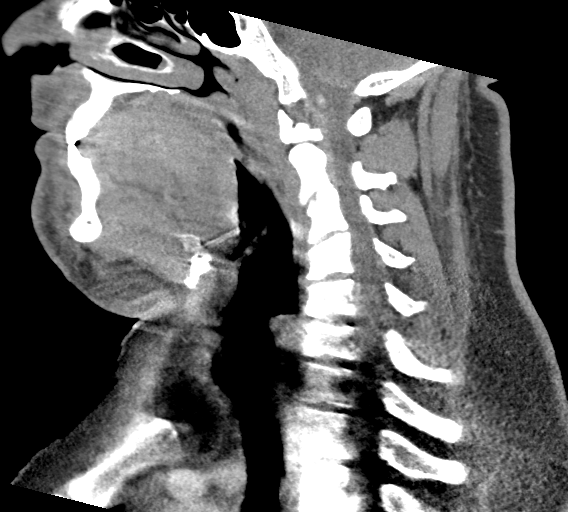
[im 46/91  bone]
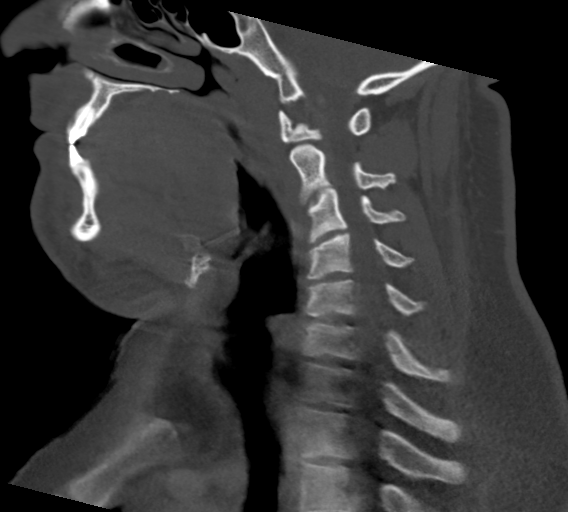
[im 53/91  bone]
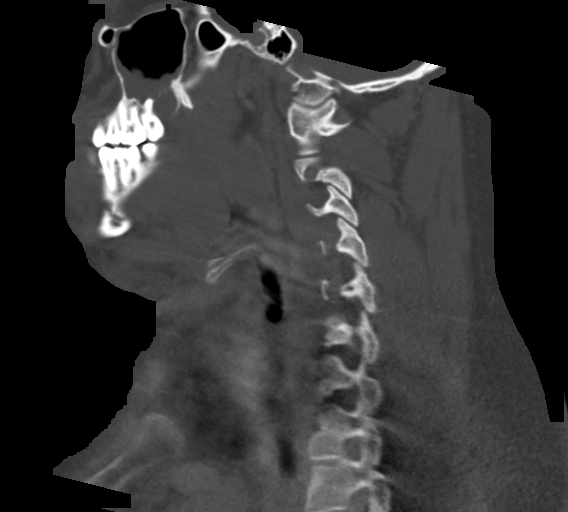
[im 61/91  bone]
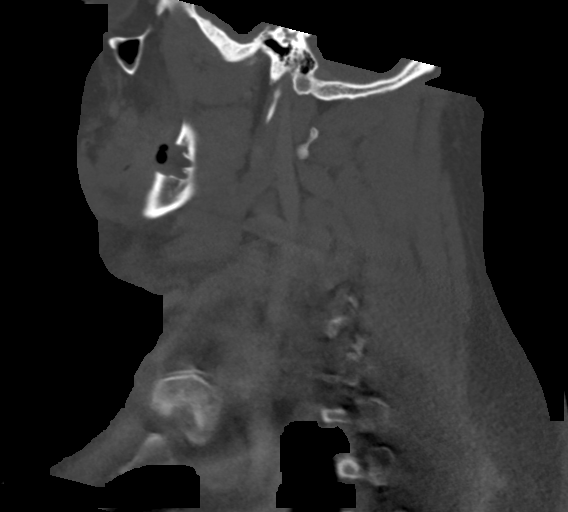

[Series 6: cor neck · coronal · 0.38mm/px · 3 of 113 slices shown]
[im 37/113  bone]
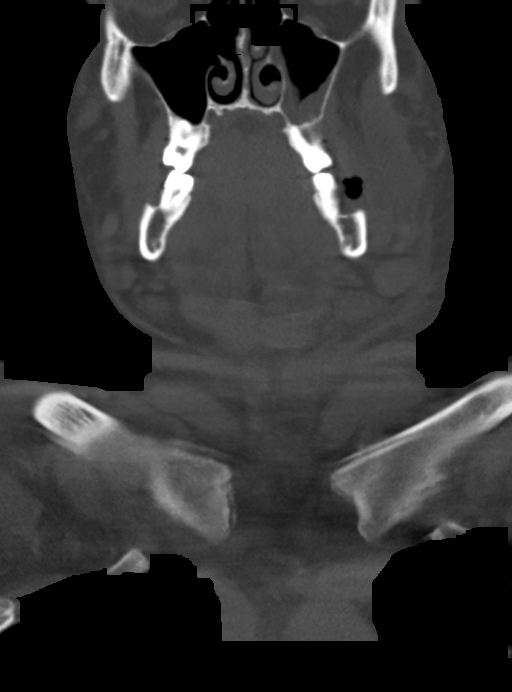
[im 50/113  bone]
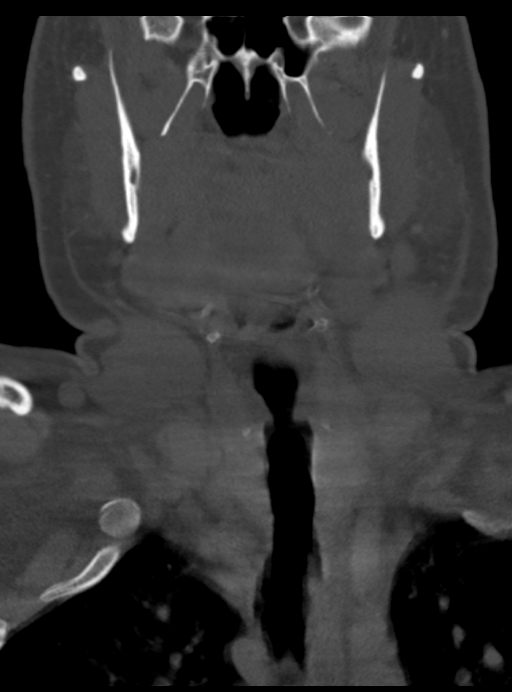
[im 63/113  bone]
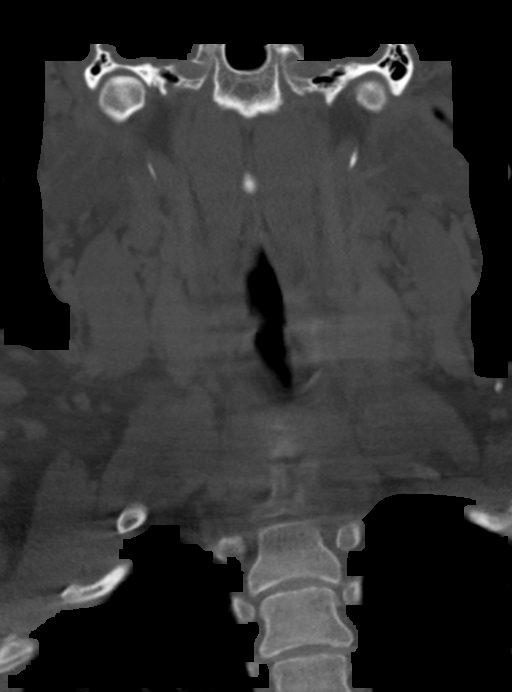

[Series 7: orthogonal ax · axial · 0.39mm/px · z∈[-276,-227]mm · 2 of 129 slices shown]
[im 26/129  bone]
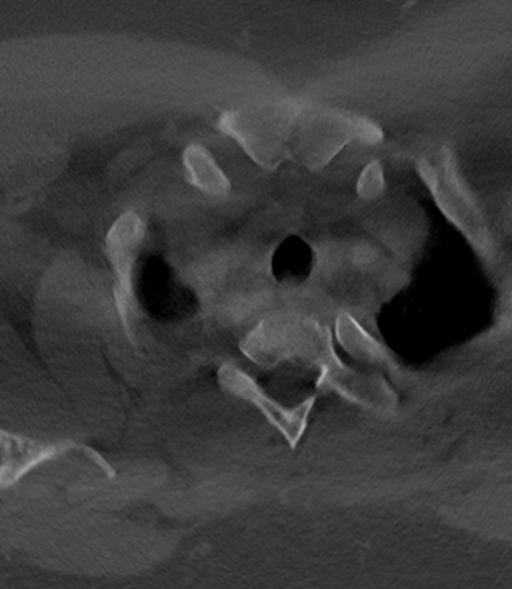
[im 52/129  bone]
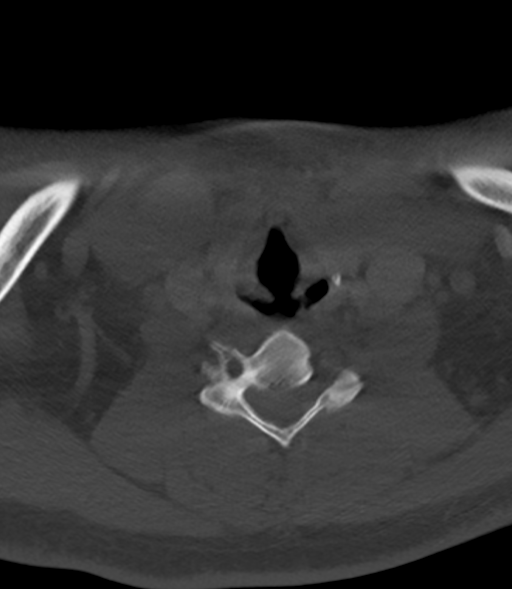

[14 of 33 positions shown; findings below may reference images not displayed]

FINDINGS: Image quality degraded by large patient size and mild patient
motion.

Pharynx and larynx: Pharyngeal soft tissues normal. No mass or
abscess. Epiglottis and larynx normal.

Soft tissue edema in the left face overlying the lateral mandible
and maxilla. This could be due to bruising from wisdom tooth
extraction and fracture of the left maxilla. Cellulitis not
excluded. No soft tissue abscess. No focal fluid collection.

Salivary glands: Parotid and submandibular glands normal
bilaterally.

Thyroid: Not well seen due to motion.

Lymph nodes: Right level 2 lymph node 12 mm with fatty hilum. Left
level 2 lymph node 15 mm with fatty hilum. No pathologic adenopathy.

Vascular: Carotid artery and jugular vein patent bilaterally.

Limited intracranial: Negative

Visualized orbits: Negative

Mastoids and visualized paranasal sinuses: Mucosal edema in the left
paranasal sinus. Remaining sinuses clear. No air-fluid level.

Skeleton: Fracture of the socket for the left upper third molar
extraction. Fracture extends into the maxillary sinus lateral wall
and is likely the cause of mucosal thickening in the maxillary
sinus. No other fractures. There has been removal of all 4 wisdom
teeth. Cervical kyphosis. No acute spinal abnormality.

Upper chest: Lung apices clear.
IMPRESSION: Recent wisdom tooth extraction with fracture the socket of the left
upper third molar. Fracture extends into the left maxillary sinus.
There is soft tissue edema overlying the left face likely due to
bruising. Cellulitis not excluded. No abscess. There is mucosal
edema in the left maxillary sinus likely related to fracture.

## 2020-01-23 ENCOUNTER — Ambulatory Visit: Payer: Self-pay | Attending: Internal Medicine

## 2020-01-23 DIAGNOSIS — Z23 Encounter for immunization: Secondary | ICD-10-CM

## 2020-01-23 NOTE — Progress Notes (Signed)
   Covid-19 Vaccination Clinic  Name:  Vincent Blevins    MRN: [id number] DOB: [birth year]  01/23/2020  Mr. [name] was observed post Covid-19 immunization for 15 minutes without incident. He was provided with Vaccine Information Sheet and instruction to access the V-Safe system.   Mr. [name] was instructed to call 911 with any severe reactions post vaccine: Marland Kitchen Difficulty breathing  . Swelling of face and throat  . A fast heartbeat  . A bad rash all over body  . Dizziness and weakness   Immunizations Administered    Name Date Dose VIS Date Route   Pfizer COVID-19 Vaccine 01/23/2020  8:11 AM 0.3 mL 10/02/2019 Intramuscular   Manufacturer: [employer], [employer]   Lot: [id number]   NDC: [address]-7

## 2020-02-20 ENCOUNTER — Ambulatory Visit: Payer: Self-pay | Attending: Internal Medicine

## 2020-02-20 DIAGNOSIS — Z23 Encounter for immunization: Secondary | ICD-10-CM

## 2020-02-20 NOTE — Progress Notes (Signed)
   Covid-19 Vaccination Clinic  Name:  Vincent Blevins    MRN: [id number] DOB: 09/02/1996  02/20/2020  [name] was observed post Covid-19 immunization for 30 minutes based on pre-vaccination screening without incident. He was provided with Vaccine Information Sheet and instruction to access the V-Safe system.   [name] was instructed to call 911 with any severe reactions post vaccine: Marland Kitchen Difficulty breathing  . Swelling of face and throat  . A fast heartbeat  . A bad rash all over body  . Dizziness and weakness   Immunizations Administered    Name Date Dose VIS Date Route   Pfizer COVID-19 Vaccine 02/20/2020  9:23 AM 0.3 mL 12/16/2018 Intramuscular   Manufacturer: [employer], [employer]   Lot: [id number]   NDC: [address]-0
